# Patient Record
Sex: Female | Born: 1970 | Race: White | Hispanic: No | Marital: Single | Smoking: Former smoker
Health system: Southern US, Community
[De-identification: ages and names within clinical notes are randomized; demographics above are authoritative.]

---

## 1992-04-06 HISTORY — PX: BACK SURGERY: SHX140

## 2017-12-06 ENCOUNTER — Ambulatory Visit
Admission: EM | Admit: 2017-12-06 | Discharge: 2017-12-06 | Disposition: A | Discharge status: Home / Self Care | Payer: BLUE CROSS/BLUE SHIELD | Attending: Family Medicine | Admitting: Family Medicine

## 2017-12-06 ENCOUNTER — Ambulatory Visit (INDEPENDENT_AMBULATORY_CARE_PROVIDER_SITE_OTHER): Payer: BLUE CROSS/BLUE SHIELD

## 2017-12-06 ENCOUNTER — Encounter: Payer: Self-pay | Admitting: Emergency Medicine

## 2017-12-06 ENCOUNTER — Other Ambulatory Visit: Payer: Self-pay

## 2017-12-06 DIAGNOSIS — S9032XA Contusion of left foot, initial encounter: Secondary | ICD-10-CM

## 2017-12-06 NOTE — ED Triage Notes (Signed)
Pt c/o left foot pain. She dropped a table on her foot. She has pain and swelling.

## 2017-12-06 NOTE — ED Provider Notes (Signed)
[facility] ____________________________________________  Time seen: Approximately 8:15 PM  I have reviewed the triage vital signs and the nursing notes.   HISTORY  Chief Complaint Foot Pain (left)   HPI Doris Mason is a 47 y.o. female presenting for evaluation of left foot pain after injury that occurred today around 1 or 2:00pm.  Patient states that she was moving a heavy outdoor table down the steps, and states when she set it down she did not realize her foot was underneath the leg.  States that she said the leg directly down on top of her foot causing pain.  States that she had a large "goose egg "to the top of her foot but after applying ice this afternoon that has improved.  Reports pain is continued prompting her to come in.  Denies issues with this foot previously.  Did take some Tylenol earlier.  Has continued to remain ambulatory but hobbling.  Denies paresthesias, pain radiation or decreased range of motion.  Denies other complaints.  No other alleviating or aggravating measures.  Pain is worse with direct palpation and ambulating.  Mild pain at rest.    History reviewed. No pertinent past medical history. Denies   There are no active problems to display for this patient.   Past Surgical History:  Procedure Laterality Date  . BACK SURGERY  1994     No current facility-administered medications for this encounter.   Current Outpatient Medications:  .  NIFEdipine (PROCARDIA XL/ADALAT-CC) 60 MG 24 hr tablet, Take by mouth., Disp: , Rfl:  .  valACYclovir (VALTREX) 500 MG tablet, TAKE 1 TABLET (500 MG TOTAL) BY MOUTH TWO (2) TIMES A DAY. TAKE FOR THREE DAYS., Disp: , Rfl: 11  Allergies Penicillins  Family History  Problem Relation Age of Onset  . Lupus Mother   . Cancer Mother     Social History Social History   Tobacco Use  . Smoking status: Former Games developer  . Smokeless tobacco: Never Used  Substance Use Topics  . Alcohol use: Never   Frequency: Never  . Drug use: Never    Review of Systems Constitutional: No fever/chills Cardiovascular: Denies chest pain. Respiratory: Denies shortness of breath. Gastrointestinal: No abdominal pain.   Musculoskeletal: Negative for back pain. As above. Skin: Negative for rash.  ____________________________________________   PHYSICAL EXAM:  VITAL SIGNS: ED Triage Vitals  Enc Vitals Group     BP 12/06/17 2001 126/66     Pulse Rate 12/06/17 2001 89     Resp 12/06/17 2001 16     Temp 12/06/17 2001 98.5 F (36.9 C)     Temp Source 12/06/17 2001 Oral     SpO2 12/06/17 2001 100 %     Weight 12/06/17 2000 138 lb (62.6 kg)     Height 12/06/17 2000 5\' 7"  (1.702 m)     Head Circumference --      Peak Flow --      Pain Score 12/06/17 2000 7     Pain Loc --      Pain Edu? --      Excl. in GC? --     Constitutional: Alert and oriented. Well appearing and in no acute distress. ENT      Head: Normocephalic and atraumatic. Cardiovascular: Normal rate, regular rhythm. Grossly normal heart sounds.  Good peripheral circulation. Respiratory: Normal respiratory effort without tachypnea nor retractions. Breath sounds are clear and equal bilaterally. No wheezes, rales, rhonchi. Musculoskeletal: Bilateral pedal pulses equal and easily palpated. Except: Left  dorsal midfoot mild to moderate tenderness to direct palpation with mild edema and ecchymosis, superficial abrasions noted, left foot and left lower externally otherwise nontender, left foot with normal sensation and capillary refill, mild pain with left foot plantarflexion and dorsiflexion as well as rotation but full range of motion present. Neurologic:  Normal speech and language. No gross focal neurologic deficits are appreciated. Speech is normal. No gait instability.  Skin:  Skin is warm, dry and intact. No rash noted. Psychiatric: Mood and affect are normal. Speech and behavior are normal. Patient exhibits appropriate insight and  judgment   ___________________________________________   LABS (all labs ordered are listed, but only abnormal results are displayed)  Labs Reviewed - No data to display ____________________________________________  RADIOLOGY  Dg Foot Complete Left  Result Date: 12/06/2017 CLINICAL DATA:  Left foot injury EXAM: LEFT FOOT - COMPLETE 3+ VIEW COMPARISON:  None. FINDINGS: No fracture or malalignment. Dorsal soft tissue swelling. No radiopaque foreign body. IMPRESSION: No acute osseous abnormality. Electronically Signed   By: Jasmine Pang M.D.   On: 12/06/2017 20:42   ____________________________________________   PROCEDURES Procedures    INITIAL IMPRESSION / ASSESSMENT AND PLAN / ED COURSE  Pertinent labs & imaging results that were available during my care of the patient were reviewed by me and considered in my medical decision making (see chart for details).  Well-appearing patient.  No acute distress.  Left foot injury that occurred this afternoon by mechanical injury.  Left foot x-ray negative.  Suspect contusion injury.  Encourage rest, fluids, supportive care.  Verified in care everywhere tetanus immunization last and April 2019.  Discussed use of postoperative shoe, patient declined.  Discussed follow up with Primary care physician this week. Discussed follow up and return parameters including no resolution or any worsening concerns. Patient verbalized understanding and agreed to plan.   ____________________________________________   FINAL CLINICAL IMPRESSION(S) / ED DIAGNOSES  Final diagnoses:  Contusion of left foot, initial encounter     ED Discharge Orders    None       Note: This dictation was prepared with Dragon dictation along with smaller phrase technology. Any transcriptional errors that result from this process are unintentional.         Renford Dills, NP 12/06/17 2048

## 2017-12-06 NOTE — Discharge Instructions (Addendum)
Take ibuprofen and Tylenol as needed.  Rest.  Ice.  Elevate.  Gradual increase of weightbearing as tolerated.  Follow up with your primary care physician this week as needed. Return to Urgent care for new or worsening concerns.

## 2018-06-09 ENCOUNTER — Other Ambulatory Visit: Payer: Self-pay

## 2018-06-09 ENCOUNTER — Ambulatory Visit
Admission: EM | Admit: 2018-06-09 | Discharge: 2018-06-09 | Disposition: A | Discharge status: Home / Self Care | Payer: BLUE CROSS/BLUE SHIELD | Attending: Family Medicine | Admitting: Family Medicine

## 2018-06-09 ENCOUNTER — Encounter: Payer: Self-pay | Admitting: Emergency Medicine

## 2018-06-09 DIAGNOSIS — J101 Influenza due to other identified influenza virus with other respiratory manifestations: Secondary | ICD-10-CM | POA: Diagnosis not present

## 2018-06-09 DIAGNOSIS — Z87891 Personal history of nicotine dependence: Secondary | ICD-10-CM

## 2018-06-09 LAB — RAPID INFLUENZA A&B ANTIGENS
Influenza A (ARMC): POSITIVE — AB
Influenza B (ARMC): NEGATIVE

## 2018-06-09 MED ORDER — OSELTAMIVIR PHOSPHATE 75 MG PO CAPS: 75.0000 mg | ORAL_CAPSULE | Freq: Two times a day (BID) | ORAL | 0 refills | Status: AC

## 2018-06-09 MED ORDER — BENZONATATE 200 MG PO CAPS: ORAL_CAPSULE | ORAL | 0 refills | Status: AC

## 2018-06-09 NOTE — ED Triage Notes (Signed)
Pt c/o cough, chills, fever, and nasal congestion. Started yesterday.

## 2018-06-09 NOTE — Discharge Instructions (Addendum)
Drink plenty of fluids.  Rest as much as possible.  Use Tylenol or Motrin for fever chills or body aches.  °

## 2018-06-09 NOTE — ED Provider Notes (Signed)
[facility]    CSN: [id number] Arrival date & time: 06/09/18  2595     History   Chief Complaint Chief Complaint  Patient presents with  . Cough    HPI Doris Mason is a 48 y.o. female.   HPI   48 year old female presents with the sudden onset yesterday of cough chills fever and nasal congestion.  He had her flu shot earlier in the year.  Several coworkers have been sick- one with the flu others with a viral illnesses.  Main problem seems to be that of body aches and cough she has had low-grade fevers upwards of 100.4.  Today she is 99.3 pulse rate is 106 BP 101/56 respiration 16 O2 sats 100% on room air.       History reviewed. No pertinent past medical history.  There are no active problems to display for this patient.   Past Surgical History:  Procedure Laterality Date  . BACK SURGERY  1994    OB History   No obstetric history on file.      Home Medications    Prior to Admission medications   Medication Sig Start Date End Date Taking? Authorizing Provider  NIFEdipine (PROCARDIA XL/ADALAT-CC) 60 MG 24 hr tablet Take by mouth. 12/02/17  Yes [provider]  valACYclovir (VALTREX) 500 MG tablet TAKE 1 TABLET (500 MG TOTAL) BY MOUTH TWO (2) TIMES A DAY. TAKE FOR THREE DAYS. 11/01/17  Yes [provider]  benzonatate (TESSALON) 200 MG capsule Take one cap TID PRN cough 06/09/18   Lutricia Feil, PA-C  oseltamivir (TAMIFLU) 75 MG capsule Take 1 capsule (75 mg total) by mouth every 12 (twelve) hours. 06/09/18   Lutricia Feil, PA-C    Family History Family History  Problem Relation Age of Onset  . Lupus Mother   . Cancer Mother     Social History Social History   Tobacco Use  . Smoking status: Former Games developer  . Smokeless tobacco: Never Used  Substance Use Topics  . Alcohol use: Yes    Frequency: Never  . Drug use: Never     Allergies   Penicillins   Review of Systems Review of Systems  Constitutional: Positive  for activity change, chills, fatigue and fever.  HENT: Positive for congestion.   Respiratory: Positive for cough and shortness of breath.   All other systems reviewed and are negative.    Physical Exam Triage Vital Signs ED Triage Vitals  Enc Vitals Group     BP 06/09/18 0824 (!) 101/56     Pulse Rate 06/09/18 0824 (!) 106     Resp 06/09/18 0824 16     Temp 06/09/18 0824 99.3 F (37.4 C)     Temp Source 06/09/18 0824 Oral     SpO2 06/09/18 0824 100 %     Weight 06/09/18 0821 135 lb (61.2 kg)     Height 06/09/18 0821 5\' 6"  (1.676 m)     Head Circumference --      Peak Flow --      Pain Score 06/09/18 0821 0     Pain Loc --      Pain Edu? --      Excl. in GC? --    No data found.  Updated Vital Signs BP (!) 101/56 (BP Location: Left Arm)   Pulse (!) 106   Temp 99.3 F (37.4 C) (Oral)   Resp 16   Ht 5\' 6"  (1.676 m)   Wt 135 lb (61.2  kg)   LMP 04/10/2018   SpO2 100%   BMI 21.79 kg/m   Visual Acuity Right Eye Distance:   Left Eye Distance:   Bilateral Distance:    Right Eye Near:   Left Eye Near:    Bilateral Near:     Physical Exam Vitals signs and nursing note reviewed.  Constitutional:      General: She is not in acute distress.    Appearance: Normal appearance. She is normal weight. She is not ill-appearing, toxic-appearing or diaphoretic.  HENT:     Head: Normocephalic and atraumatic.     Right Ear: Tympanic membrane, ear canal and external ear normal.     Left Ear: Tympanic membrane, ear canal and external ear normal.     Nose: Congestion present. No rhinorrhea.     Mouth/Throat:     Mouth: Mucous membranes are moist.     Pharynx: Oropharynx is clear. No oropharyngeal exudate or posterior oropharyngeal erythema.  Eyes:     General:        Right eye: No discharge.        Left eye: No discharge.     Conjunctiva/sclera: Conjunctivae normal.     Pupils: Pupils are equal, round, and reactive to light.  Neck:     Musculoskeletal: Normal range of  motion and neck supple.  Pulmonary:     Effort: Pulmonary effort is normal.     Breath sounds: Normal breath sounds.  Musculoskeletal: Normal range of motion.  Lymphadenopathy:     Cervical: No cervical adenopathy.  Skin:    General: Skin is warm and dry.  Neurological:     General: No focal deficit present.     Mental Status: She is alert and oriented to person, place, and time.  Psychiatric:        Mood and Affect: Mood normal.        Behavior: Behavior normal.        Thought Content: Thought content normal.        Judgment: Judgment normal.      UC Treatments / Results  Labs (all labs ordered are listed, but only abnormal results are displayed) Labs Reviewed  RAPID INFLUENZA A&B ANTIGENS ([facility] ONLY) - Abnormal; Notable for the following components:      Result Value   Influenza A ([facility]) POSITIVE (*)    All other components within normal limits    EKG None  Radiology No results found.  Procedures Procedures (including critical care time)  Medications Ordered in UC Medications - No data to display  Initial Impression / Assessment and Plan / UC Course  I have reviewed the triage vital signs and the nursing notes.  Pertinent labs & imaging results that were available during my care of the patient were reviewed by me and considered in my medical decision making (see chart for details).   Patient has influenza A.  Her symptomatically.  She has chosen to try Tamiflu.  She will been out of work until her fever has been gone for 24 hours.  Drink plenty of fluids get adequate rest and use Tylenol or Motrin for body aches or fever.   Final Clinical Impressions(s) / UC Diagnoses   Final diagnoses:  Influenza A     Discharge Instructions     Drink plenty of fluids.  Rest as much as possible.  Use Tylenol or Motrin for fever chills or body aches.    ED Prescriptions    Medication Sig Dispense Auth. Provider  oseltamivir (TAMIFLU) 75 MG capsule Take 1 capsule (75  mg total) by mouth every 12 (twelve) hours. 10 capsule Lutricia Feil, PA-C   benzonatate (TESSALON) 200 MG capsule Take one cap TID PRN cough 30 capsule Lutricia Feil, PA-C     Controlled Substance Prescriptions [place] Controlled Substance Registry consulted? Not Applicable   Lutricia Feil, PA-C 06/09/18 6712

## 2020-04-10 IMAGING — CR DG FOOT COMPLETE 3+V*L*
3 series · 3 of 3 positions shown · non-contrast
Comparison: None.

CLINICAL DATA: Left foot injury

EXAM:
LEFT FOOT - COMPLETE 3+ VIEW

[foot ap]
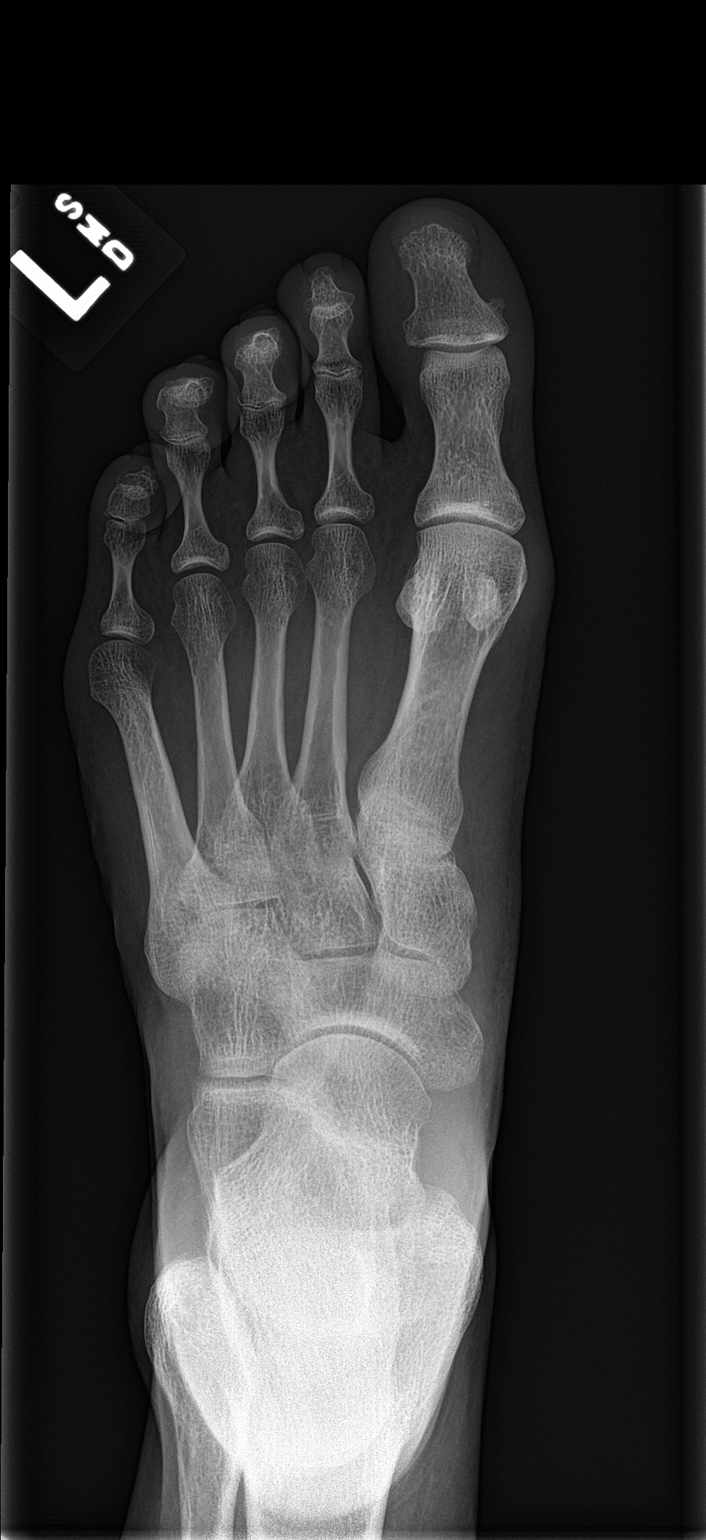

[foot obl]
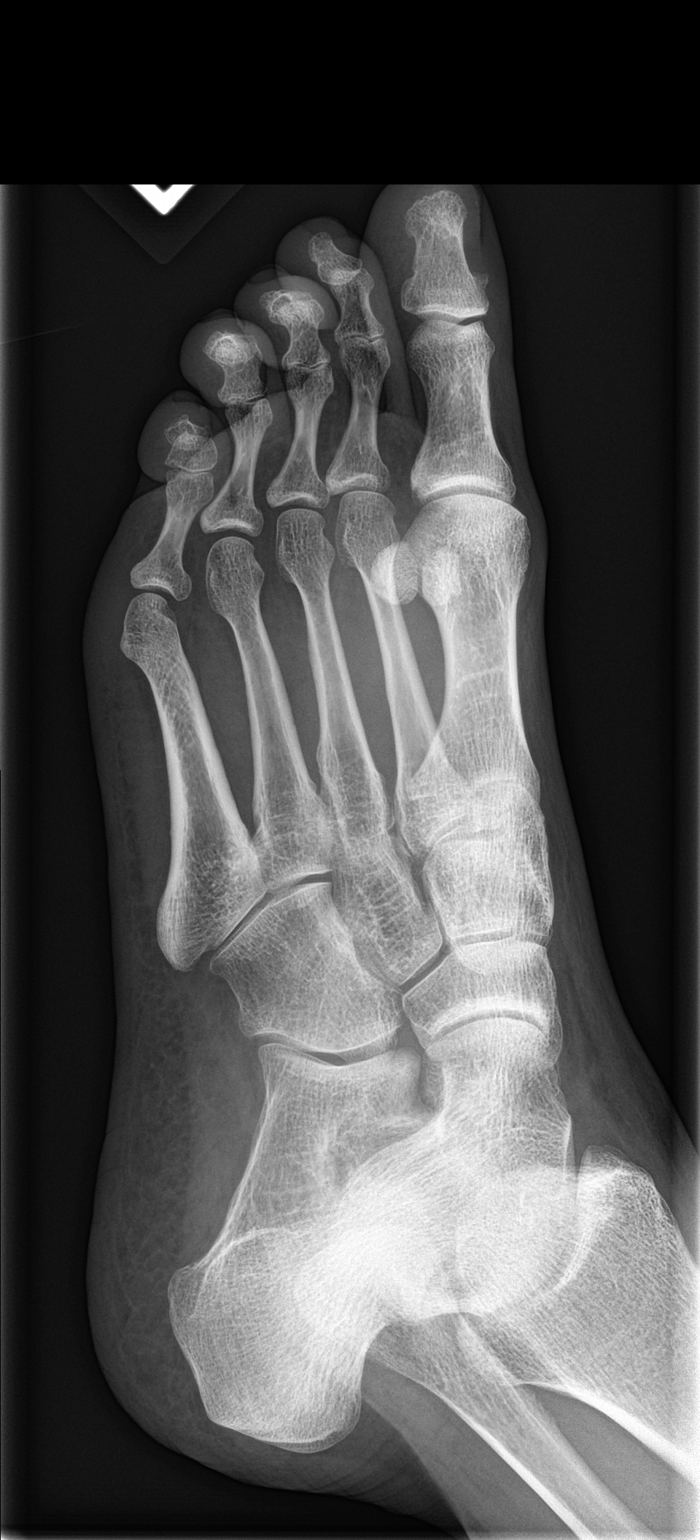

[foot lat]
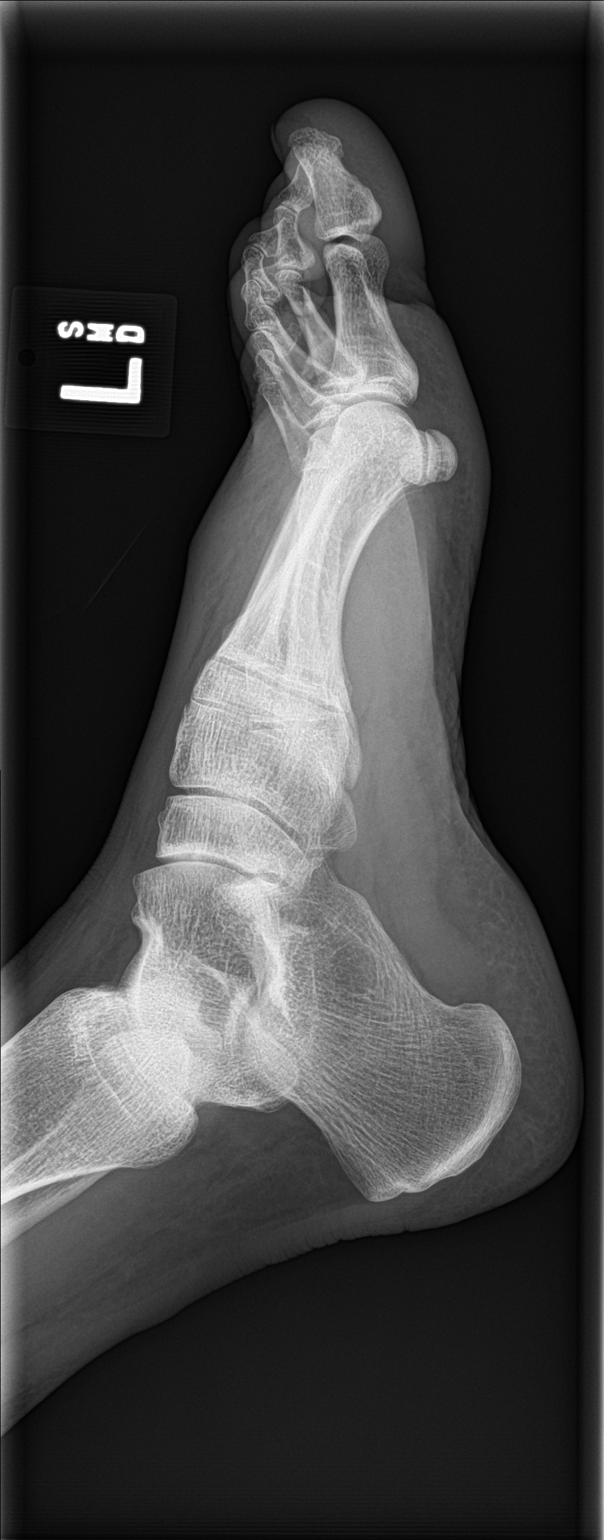

[3 of 3 positions shown; findings below may reference images not displayed]

FINDINGS: No fracture or malalignment. Dorsal soft tissue swelling. No
radiopaque foreign body.
IMPRESSION: No acute osseous abnormality.

## 2022-03-10 ENCOUNTER — Ambulatory Visit
Admission: EM | Admit: 2022-03-10 | Discharge: 2022-03-10 | Disposition: A | Discharge status: Home / Self Care | Payer: BC Managed Care – PPO | Attending: Physician Assistant | Admitting: Physician Assistant

## 2022-03-10 ENCOUNTER — Encounter: Payer: Self-pay | Admitting: Emergency Medicine

## 2022-03-10 ENCOUNTER — Ambulatory Visit (INDEPENDENT_AMBULATORY_CARE_PROVIDER_SITE_OTHER): Payer: BC Managed Care – PPO

## 2022-03-10 DIAGNOSIS — M25531 Pain in right wrist: Secondary | ICD-10-CM | POA: Diagnosis not present

## 2022-03-10 DIAGNOSIS — M8580 Other specified disorders of bone density and structure, unspecified site: Secondary | ICD-10-CM

## 2022-03-10 DIAGNOSIS — M79641 Pain in right hand: Secondary | ICD-10-CM | POA: Diagnosis not present

## 2022-03-10 DIAGNOSIS — M779 Enthesopathy, unspecified: Secondary | ICD-10-CM

## 2022-03-10 NOTE — ED Provider Notes (Signed)
[facility]    CSN: [id number] Arrival date & time: 03/10/22  1958      History   Chief Complaint Chief Complaint  Patient presents with   Wrist Pain    right    HPI Doris Mason is a 51 y.o. female presenting for a pain of the right wrist since earlier this morning.  She says that she was moving a certain way and she heard a pop.  She denies any injury or trauma to the wrist.  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  She says when she was driving home from work today she could not even grip her hand on the gearshift or because her pain was so severe.  She says that she took ibuprofen and it helped her pain significantly.  She reports she was trying to wrap her wrist at home and her husband became concerned and advised her to go to the urgent care.  She has not had any increased numbness of the wrist or hand.  Reports history of Raynaud's.  Reports history of carpal tunnel previously.  She has not had any history of tendinitis.  She has no other concerns.  HPI  History reviewed. No pertinent past medical history.  There are no problems to display for this patient.   Past Surgical History:  Procedure Laterality Date   BACK SURGERY  1994    OB History   No obstetric history on file.      Home Medications    Prior to Admission medications   Medication Sig Start Date End Date Taking? Authorizing Provider  benzonatate (TESSALON) 200 MG capsule Take one cap TID PRN cough 06/09/18   Lutricia Feil, PA-C  NIFEdipine (PROCARDIA XL/ADALAT-CC) 60 MG 24 hr tablet Take by mouth. 12/02/17   [provider]  oseltamivir (TAMIFLU) 75 MG capsule Take 1 capsule (75 mg total) by mouth every 12 (twelve) hours. 06/09/18   Lutricia Feil, PA-C  valACYclovir (VALTREX) 500 MG tablet TAKE 1 TABLET (500 MG TOTAL) BY MOUTH TWO (2) TIMES A DAY. TAKE FOR THREE DAYS. 11/01/17   [provider]    Family History Family History  Problem Relation Age of Onset   Lupus Mother    Cancer Mother     Social History Social History   Tobacco Use   Smoking status: Former   Smokeless tobacco: Never  Building services engineer Use: Never used  Substance Use Topics   Alcohol use: Yes   Drug use: Never     Allergies   Penicillins   Review of Systems Review of Systems  Musculoskeletal:  Positive for arthralgias. Negative for joint swelling.  Skin:  Negative for color change and wound.  Neurological:  Positive for numbness. Negative for weakness.     Physical Exam Triage Vital Signs ED Triage Vitals  Enc Vitals Group     BP 03/10/22 2015 124/80     Pulse Rate 03/10/22 2015 85     Resp 03/10/22 2015 16     Temp 03/10/22 2015 99.1 F (37.3 C)     Temp Source 03/10/22 2015 Oral     SpO2 03/10/22 2015 100 %     Weight 03/10/22 2014 134 lb 14.7 oz (61.2 kg)     Height 03/10/22 2014 5\' 6"  (1.676 m)     Head Circumference --  Peak Flow --      Pain Score 03/10/22 2014 6     Pain Loc --      Pain Edu? --      Excl. in GC? --    No data found.  Updated Vital Signs BP 124/80 (BP Location: Left Arm)   Pulse 85   Temp 99.1 F (37.3 C) (Oral)   Resp 16   Ht 5\' 6"  (1.676 m)   Wt 134 lb 14.7 oz (61.2 kg)   SpO2 100%   BMI 21.78 kg/m    Physical Exam Vitals and nursing note reviewed.  Constitutional:      General: She is not in acute distress.    Appearance: Normal appearance. She is not ill-appearing or toxic-appearing.  HENT:     Head: Normocephalic and atraumatic.  Eyes:     General: No scleral icterus.       Right eye: No discharge.        Left eye: No discharge.     Conjunctiva/sclera: Conjunctivae normal.  Cardiovascular:     Rate and Rhythm: Normal rate and regular rhythm.     Pulses: Normal pulses.  Pulmonary:     Effort: Pulmonary effort is normal. No respiratory distress.  Musculoskeletal:     Right wrist: Tenderness (TTP DRUJ,  distal radius, base of thum) present. No swelling. Normal range of motion. Normal pulse.     Cervical back: Neck supple.     Comments: Negative Tinel's at wrist  Skin:    General: Skin is dry.  Neurological:     General: No focal deficit present.     Mental Status: She is alert. Mental status is at baseline.     Motor: No weakness.     Gait: Gait normal.  Psychiatric:        Mood and Affect: Mood normal.        Behavior: Behavior normal.        Thought Content: Thought content normal.      UC Treatments / Results  Labs (all labs ordered are listed, but only abnormal results are displayed) Labs Reviewed - No data to display  EKG   Radiology DG Hand Complete Right  Result Date: 03/10/2022 CLINICAL DATA:  Trauma to the right hand. EXAM: RIGHT HAND - COMPLETE 3+ VIEW COMPARISON:  None Available. FINDINGS: No acute fracture or dislocation. No arthritic change. 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 The soft tissues are unremarkable. IMPRESSION: 1. No acute fracture or dislocation. 2. Juxta-articular osteopenia. Electronically Signed   By: 14/08/2021 M.D.   On: 03/10/2022 20:14   DG Wrist Complete Right  Result Date: 03/10/2022 CLINICAL DATA:  Trauma to the right wrist. EXAM: RIGHT WRIST - COMPLETE 3+ VIEW COMPARISON:  None Available. FINDINGS: No acute fracture or dislocation. The bones are osteopenic for age. The soft tissues are unremarkable. IMPRESSION: 1. No acute fracture or dislocation. 2. Osteopenia. Electronically Signed   By: 14/08/2021 M.D.   On: 03/10/2022 20:13    Procedures Procedures (including critical care time)  Medications Ordered in UC Medications - No data to display  Initial Impression / Assessment and Plan / UC Course  I have reviewed the triage vital signs and the nursing notes.  Pertinent labs & imaging results that were available during my care of the patient were reviewed by me and considered in my medical decision making (see chart for  details).   51 year old female presents for right wrist pain that began earlier today.  Reports  moving her wrist wrong way and hearing and feeling a pop.  Since then has had pain but pain has significantly improved with use of NSAIDs before arrival.  Reports occasional numbness but says she has history of Raynaud's and always has numb hands.  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  X-ray obtained today shows no acute abnormality but does show underlying osteopenia which I discussed with the patient advised she follow-up with her PCP for bone density scan and start taking vitamin D and calcium.  We discussed that that does make her more at risk for fractures.  Suspect tendinitis.  Will treat with however following RICE guidelines.  Also patient was given a wrist brace.  Advise follow-up with Ortho especially if not improving over the next week or symptoms worsen.   Final Clinical Impressions(s) / UC Diagnoses   Final diagnoses:  Right wrist pain  Tendinitis     Discharge Instructions      WRIST PAIN: No fractures. Likely tendinitis. Stressed avoiding painful activities . Reviewed RICE guidelines. Use medications as directed, including NSAIDs. If no NSAIDs have been prescribed for you today, you may take Aleve or Motrin over the counter. May use Tylenol in between doses of NSAIDs.  If no improvement in the next 1-2 weeks, f/u with PCP or return to our office for reexamination, and please feel free to call or return at any time for any questions or concerns you may have and we will be happy to help you!        ED Prescriptions   None    PDMP not reviewed this encounter.   Shirlee Latch, PA-C 03/13/22 [phone]

## 2022-03-10 NOTE — ED Triage Notes (Signed)
Pt c/o right wrist pain. Started this morning she states she heard it pop and has been getting worse as the day went on. She states when she left work she couldn't use her gear shifter while driving.

## 2022-03-10 NOTE — Discharge Instructions (Signed)
WRIST PAIN: No fractures. Likely tendinitis. Stressed avoiding painful activities . Reviewed RICE guidelines. Use medications as directed, including NSAIDs. If no NSAIDs have been prescribed for you today, you may take Aleve or Motrin over the counter. May use Tylenol in between doses of NSAIDs.  If no improvement in the next 1-2 weeks, f/u with PCP or return to our office for reexamination, and please feel free to call or return at any time for any questions or concerns you may have and we will be happy to help you!
